# Patient Record
Sex: Male | Born: 1985 | Hispanic: No | Marital: Single | Smoking: Current some day smoker
Health system: Southern US, Community
[De-identification: ages and names within clinical notes are randomized; demographics above are authoritative.]

## PROBLEM LIST (undated history)

## (undated) DIAGNOSIS — M1990 Unspecified osteoarthritis, unspecified site: Secondary | ICD-10-CM

---

## 2008-04-01 ENCOUNTER — Emergency Department (HOSPITAL_COMMUNITY): Admission: EM | Admit: 2008-04-01 | Discharge: 2008-04-01 | Payer: Self-pay | Admitting: Emergency Medicine

## 2010-09-28 ENCOUNTER — Emergency Department (HOSPITAL_BASED_OUTPATIENT_CLINIC_OR_DEPARTMENT_OTHER)
Admission: EM | Admit: 2010-09-28 | Discharge: 2010-09-28 | Disposition: A | Discharge status: Home / Self Care | Payer: Self-pay | Attending: Emergency Medicine | Admitting: Emergency Medicine

## 2010-09-28 DIAGNOSIS — N471 Phimosis: Secondary | ICD-10-CM | POA: Insufficient documentation

## 2010-09-28 LAB — URINE MICROSCOPIC-ADD ON

## 2010-09-28 LAB — URINALYSIS, ROUTINE W REFLEX MICROSCOPIC
Glucose, UA: NEGATIVE mg/dL
Protein, ur: NEGATIVE mg/dL
Urobilinogen, UA: 1 mg/dL (ref 0.0–1.0)

## 2010-09-29 LAB — HIV ANTIBODY (ROUTINE TESTING W REFLEX): HIV: NONREACTIVE

## 2010-09-29 LAB — URINE CULTURE: Culture: NO GROWTH

## 2010-09-29 LAB — GC/CHLAMYDIA PROBE AMP, GENITAL: GC Probe Amp, Genital: NEGATIVE

## 2010-09-29 LAB — RPR: RPR Ser Ql: NONREACTIVE

## 2010-11-07 ENCOUNTER — Inpatient Hospital Stay (HOSPITAL_COMMUNITY)
Admission: AD | Admit: 2010-11-07 | Discharge: 2010-11-08 | DRG: 208 | Disposition: A | Discharge status: Home / Self Care | Payer: Self-pay | Source: Other Acute Inpatient Hospital | Attending: Urology | Admitting: Urology

## 2010-11-07 ENCOUNTER — Ambulatory Visit (HOSPITAL_BASED_OUTPATIENT_CLINIC_OR_DEPARTMENT_OTHER)
Admission: RE | Admit: 2010-11-07 | Discharge: 2010-11-07 | Payer: Self-pay | Source: Ambulatory Visit | Attending: Urology | Admitting: Urology

## 2010-11-07 ENCOUNTER — Ambulatory Visit (HOSPITAL_COMMUNITY): Payer: Self-pay

## 2010-11-07 DIAGNOSIS — J9600 Acute respiratory failure, unspecified whether with hypoxia or hypercapnia: Secondary | ICD-10-CM

## 2010-11-07 DIAGNOSIS — J45909 Unspecified asthma, uncomplicated: Secondary | ICD-10-CM | POA: Diagnosis present

## 2010-11-07 DIAGNOSIS — N478 Other disorders of prepuce: Secondary | ICD-10-CM | POA: Insufficient documentation

## 2010-11-07 DIAGNOSIS — N471 Phimosis: Secondary | ICD-10-CM | POA: Diagnosis present

## 2010-11-07 DIAGNOSIS — T17208A Unspecified foreign body in pharynx causing other injury, initial encounter: Secondary | ICD-10-CM | POA: Insufficient documentation

## 2010-11-07 DIAGNOSIS — IMO0002 Reserved for concepts with insufficient information to code with codable children: Secondary | ICD-10-CM | POA: Insufficient documentation

## 2010-11-07 DIAGNOSIS — J984 Other disorders of lung: Secondary | ICD-10-CM | POA: Diagnosis present

## 2010-11-07 DIAGNOSIS — R918 Other nonspecific abnormal finding of lung field: Secondary | ICD-10-CM | POA: Diagnosis present

## 2010-11-07 DIAGNOSIS — Z5309 Procedure and treatment not carried out because of other contraindication: Secondary | ICD-10-CM | POA: Insufficient documentation

## 2010-11-07 DIAGNOSIS — F17200 Nicotine dependence, unspecified, uncomplicated: Secondary | ICD-10-CM | POA: Diagnosis present

## 2010-11-07 DIAGNOSIS — J9819 Other pulmonary collapse: Secondary | ICD-10-CM

## 2010-11-07 DIAGNOSIS — T17308A Unspecified foreign body in larynx causing other injury, initial encounter: Secondary | ICD-10-CM | POA: Diagnosis present

## 2010-11-07 LAB — MRSA PCR SCREENING: MRSA by PCR: NEGATIVE

## 2010-11-08 ENCOUNTER — Inpatient Hospital Stay (HOSPITAL_COMMUNITY): Payer: Self-pay

## 2010-11-08 LAB — CBC
MCV: 88.1 fL (ref 78.0–100.0)
Platelets: 237 10*3/uL (ref 150–400)
RBC: 5.48 MIL/uL (ref 4.22–5.81)
RDW: 12.8 % (ref 11.5–15.5)
WBC: 9.3 10*3/uL (ref 4.0–10.5)

## 2010-12-15 ENCOUNTER — Inpatient Hospital Stay (INDEPENDENT_AMBULATORY_CARE_PROVIDER_SITE_OTHER)
Admission: RE | Admit: 2010-12-15 | Discharge: 2010-12-15 | Disposition: A | Discharge status: Home / Self Care | Payer: Self-pay | Source: Ambulatory Visit

## 2010-12-15 DIAGNOSIS — H109 Unspecified conjunctivitis: Secondary | ICD-10-CM

## 2011-01-13 NOTE — Discharge Summary (Signed)
  NAMEDAMARI, SUASTEGUI NO.:  [id number]  MEDICAL RECORD NO.:  [id number]  LOCATION:  1231                         FACILITY:  [facility]  PHYSICIAN:  Bertram Millard. Irys Nigh, M.D.DATE OF BIRTH:  1986/01/17  DATE OF ADMISSION:  11/07/2010 DATE OF DISCHARGE:  11/08/2010                              DISCHARGE SUMMARY   PRIMARY DIAGNOSIS:  Aspiration during anesthesia  OTHER DIAGNOSIS:  Phimosis.  PRINCIPAL PROCEDURE:  None.  BRIEF HISTORY:  This 25 year old male presented to my office with phimosis.  He was scheduled for anesthetic circumcision/dorsal slit on Nov 07, 2010.  The patient was brought to the operating room, and after induction of anesthesia, aspirated.  He underwent emergent intubation and the procedure was cancelled.  Because of his aspiration, the patient is admitted to the hospital postanesthesia for aspiration and pulmonary management.  HOSPITAL COURSE:  Following the patient's aspiration after induction (it was felt that he did not maintain n.p.o. status), the patient was taken from the recovery room at [facility] to the intensive care unit.  Because of his aspiration, critical care consultation was obtained.  The patient was put on nebulizers and oxygen supplementation. Amazingly, his pulmonary status improved rapidly.  Chest x-ray revealed minimal subsegmental atelectasis in the right lower lobe but no other abnormality.  As the patient had no significant pulmonary or hemodynamic consequences, he was discharged on postoperative day #1 or he was discharged on Nov 08, 2010.  He will call to follow up with me in the office and we will attempt eventual dorsal slit circumcision in the office.  He was not discharged on any specific medications.  He was in improved condition at the time of discharge.     Bertram Millard. Retta Diones, M.D.     SMD/MEDQ  D:  12/29/2010  T:  12/29/2010  Job:  [id number]  Electronically Signed by Marcine Matar  M.D. on 01/13/2011 04:50:34 PM

## 2011-01-31 NOTE — H&P (Signed)
  [name], [name] NO.:  [id number]  MEDICAL RECORD NO.:  [id number]  LOCATION:  1231                         FACILITY:  [facility]  PHYSICIAN:  Bertram Millard. Dyan Labarbera, M.D.DATE OF BIRTH:  [birth year]  DATE OF ADMISSION:  11/07/2010 DATE OF DISCHARGE:  11/08/2010                             HISTORY & PHYSICAL   PRIMARY DIAGNOSIS:  Intraoperative aspiration.  SECONDARY DIAGNOSIS:  Phimosis.  PROCEDURES:  None.  BRIEF HISTORY:  This is a 25 year old male who originally presented to my office in April of this year with significant foreskin problems.  The patient has phimosis and tearing of the foreskin with retraction with intercourse.  He desires circumcision, but would like a dorsal slit rather than a circumcision to remove his foreskin.  He presented to the operating room for anesthetic procedure to have the circumcision.  Unfortunately, after induction of anesthesia, the patient vomited. There was obvious aspiration.  He had a large volume of vomitus, and aspirate.  Prompt attention by the anesthesia staff was given to the patient.  He was intubated, his lungs were evacuated of all the vomitus, and he is admitted for observation.  His past medical history is significant only for asthma.  He has had no prior surgical procedures.  He is not on any regular medications.  There are no known drug allergies.  The patient is single.  He is an International aid/development worker at [employer].  He is a cigarette smoker.  He drinks occasionally.  REVIEW OF SYSTEMS:  Unremarkable except for dysuria and penile pain and occasional nausea.  PHYSICAL EXAMINATION:  GENERAL:  Pleasant, healthy-appearing young adult male. HEENT:  Normal. NECK:  Supple. CHEST:  Revealed some rhonchi bilaterally. HEART:  Regular rate and rhythm. ABDOMEN:  Unremarkable.  Phallus was uncircumcised.  There was phimosis noted. GENITOURINARY:  Exam was otherwise unremarkable.  Chest x-ray was pending at  the time of his admission.  IMPRESSION: 1. Aspiration while under anesthesia. 2. Phimosis.  His surgery was cancelled.  PLAN: 1. We will admit him to the step-down unit for pulmonary management. 2. We will observe him and treat his aspiration appropriately.     Bertram Millard. [name], M.D.     SMD/MEDQ  D:  01/13/2011  T:  01/13/2011  Job:  [id number]  Electronically Signed by Marcine Matar M.D. on 01/31/2011 04:53:07 PM

## 2014-01-23 ENCOUNTER — Emergency Department (HOSPITAL_BASED_OUTPATIENT_CLINIC_OR_DEPARTMENT_OTHER)
Admission: EM | Admit: 2014-01-23 | Discharge: 2014-01-23 | Disposition: A | Discharge status: Home / Self Care | Payer: Self-pay | Attending: Emergency Medicine | Admitting: Emergency Medicine

## 2014-01-23 ENCOUNTER — Emergency Department (HOSPITAL_BASED_OUTPATIENT_CLINIC_OR_DEPARTMENT_OTHER): Payer: Self-pay

## 2014-01-23 ENCOUNTER — Encounter (HOSPITAL_BASED_OUTPATIENT_CLINIC_OR_DEPARTMENT_OTHER): Payer: Self-pay | Admitting: Emergency Medicine

## 2014-01-23 DIAGNOSIS — R1032 Left lower quadrant pain: Secondary | ICD-10-CM | POA: Insufficient documentation

## 2014-01-23 DIAGNOSIS — N200 Calculus of kidney: Secondary | ICD-10-CM | POA: Insufficient documentation

## 2014-01-23 DIAGNOSIS — Z8739 Personal history of other diseases of the musculoskeletal system and connective tissue: Secondary | ICD-10-CM | POA: Insufficient documentation

## 2014-01-23 DIAGNOSIS — E669 Obesity, unspecified: Secondary | ICD-10-CM | POA: Insufficient documentation

## 2014-01-23 DIAGNOSIS — F17200 Nicotine dependence, unspecified, uncomplicated: Secondary | ICD-10-CM | POA: Insufficient documentation

## 2014-01-23 HISTORY — DX: Unspecified osteoarthritis, unspecified site: M19.90

## 2014-01-23 LAB — URINALYSIS, ROUTINE W REFLEX MICROSCOPIC
Bilirubin Urine: NEGATIVE
Glucose, UA: NEGATIVE mg/dL
KETONES UR: NEGATIVE mg/dL
Nitrite: NEGATIVE
PROTEIN: 30 mg/dL — AB
Specific Gravity, Urine: 1.019 (ref 1.005–1.030)
UROBILINOGEN UA: 0.2 mg/dL (ref 0.0–1.0)
pH: 6 (ref 5.0–8.0)

## 2014-01-23 LAB — CBC WITH DIFFERENTIAL/PLATELET
BASOS ABS: 0 10*3/uL (ref 0.0–0.1)
BASOS PCT: 0 % (ref 0–1)
EOS ABS: 0.1 10*3/uL (ref 0.0–0.7)
EOS PCT: 1 % (ref 0–5)
HEMATOCRIT: 48.2 % (ref 39.0–52.0)
Hemoglobin: 17.2 g/dL — ABNORMAL HIGH (ref 13.0–17.0)
Lymphocytes Relative: 10 % — ABNORMAL LOW (ref 12–46)
Lymphs Abs: 1.1 10*3/uL (ref 0.7–4.0)
MCH: 30.2 pg (ref 26.0–34.0)
MCHC: 35.7 g/dL (ref 30.0–36.0)
MCV: 84.7 fL (ref 78.0–100.0)
MONO ABS: 0.5 10*3/uL (ref 0.1–1.0)
Monocytes Relative: 5 % (ref 3–12)
Neutro Abs: 9 10*3/uL — ABNORMAL HIGH (ref 1.7–7.7)
Neutrophils Relative %: 85 % — ABNORMAL HIGH (ref 43–77)
Platelets: 254 10*3/uL (ref 150–400)
RBC: 5.69 MIL/uL (ref 4.22–5.81)
RDW: 13.1 % (ref 11.5–15.5)
WBC: 10.6 10*3/uL — ABNORMAL HIGH (ref 4.0–10.5)

## 2014-01-23 LAB — HEPATIC FUNCTION PANEL
ALT: 122 U/L — ABNORMAL HIGH (ref 0–53)
AST: 53 U/L — ABNORMAL HIGH (ref 0–37)
Albumin: 4.5 g/dL (ref 3.5–5.2)
Alkaline Phosphatase: 84 U/L (ref 39–117)
Total Bilirubin: 0.4 mg/dL (ref 0.3–1.2)
Total Protein: 7.9 g/dL (ref 6.0–8.3)

## 2014-01-23 LAB — URINE MICROSCOPIC-ADD ON

## 2014-01-23 LAB — BASIC METABOLIC PANEL
ANION GAP: 11 (ref 5–15)
BUN: 13 mg/dL (ref 6–23)
CALCIUM: 10 mg/dL (ref 8.4–10.5)
CO2: 27 mEq/L (ref 19–32)
CREATININE: 0.9 mg/dL (ref 0.50–1.35)
Chloride: 102 mEq/L (ref 96–112)
GFR calc non Af Amer: >90 mL/min (ref >90)
Glucose, Bld: 133 mg/dL — ABNORMAL HIGH (ref 70–99)
Potassium: 5.8 mEq/L — ABNORMAL HIGH (ref 3.7–5.3)
Sodium: 140 mEq/L (ref 137–147)

## 2014-01-23 LAB — LIPASE, BLOOD: Lipase: 14 U/L (ref 11–59)

## 2014-01-23 MED ORDER — HYDROMORPHONE HCL PF 1 MG/ML IJ SOLN
1.0000 mg | Freq: Once | INTRAMUSCULAR | Status: AC
  Administered 2014-01-23: 1 mg via INTRAVENOUS
  Filled 2014-01-23: qty 1

## 2014-01-23 MED ORDER — MORPHINE SULFATE 4 MG/ML IJ SOLN
4.0000 mg | Freq: Once | INTRAMUSCULAR | Status: AC
  Administered 2014-01-23: 4 mg via INTRAVENOUS
  Filled 2014-01-23: qty 1

## 2014-01-23 MED ORDER — ONDANSETRON HCL 4 MG/2ML IJ SOLN
4.0000 mg | Freq: Once | INTRAMUSCULAR | Status: AC
  Administered 2014-01-23: 4 mg via INTRAVENOUS
  Filled 2014-01-23: qty 2

## 2014-01-23 MED ORDER — SODIUM CHLORIDE 0.9 % IV BOLUS (SEPSIS)
1000.0000 mL | Freq: Once | INTRAVENOUS | Status: AC
  Administered 2014-01-23: 1000 mL via INTRAVENOUS

## 2014-01-23 MED ORDER — PROMETHAZINE HCL 25 MG PO TABS: 25.0000 mg | ORAL_TABLET | Freq: Four times a day (QID) | ORAL | Status: AC | PRN

## 2014-01-23 MED ORDER — OXYCODONE-ACETAMINOPHEN 5-325 MG PO TABS: ORAL_TABLET | ORAL | Status: AC

## 2014-01-23 MED ORDER — OXYCODONE-ACETAMINOPHEN 5-325 MG PO TABS
1.0000 | ORAL_TABLET | Freq: Once | ORAL | Status: AC
  Administered 2014-01-23: 1 via ORAL
  Filled 2014-01-23: qty 1

## 2014-01-23 NOTE — ED Notes (Signed)
Pt tolerating water with no n/v/d noted.

## 2014-01-23 NOTE — ED Provider Notes (Signed)
Medical screening examination/treatment/procedure(s) were performed by non-physician practitioner and as supervising physician I was immediately available for consultation/collaboration.   EKG Interpretation None        Shabrea Weldin, MD 01/23/14 2320 

## 2014-01-23 NOTE — ED Provider Notes (Signed)
CSN: [id number]     Arrival date & time 01/23/14  1338 History   First MD Initiated Contact with Patient 01/23/14 1402     Chief Complaint  Patient presents with  . Abdominal Pain     (Consider location/radiation/quality/duration/timing/severity/associated sxs/prior Treatment) HPI   [name] is a 28 y.o. male who is otherwise healthy complaining of acute onset of non radiating left lower quadrant pain onset 3 hours ago associated with 5 episodes of nonbloody, nonbilious, coffee-ground emesis. Patient denies fever, chills, diarrhea, hematuria, chest pain or shortness of breath. No pain medication prior to arrival. States pain is relieved by pressing on the affected area. No prior similar episodes, no history of kidney stones. Patient states that he had an opiate addiction problem in the past (states it was pills mostly Vicodin) and requests nonnarcotic pain medication  Past Medical History  Diagnosis Date  . Arthritis    History reviewed. No pertinent past surgical history. No family history on file. History  Substance Use Topics  . Smoking status: Current Some Day Smoker  . Smokeless tobacco: Not on file  . Alcohol Use: Yes     Comment: occ    Review of Systems 10 systems reviewed and found to be negative, except as noted in the HPI.    Allergies  Review of patient's allergies indicates no known allergies.  Home Medications   Prior to Admission medications   Medication Sig Start Date End Date Taking? Authorizing Provider  oxyCODONE-acetaminophen (PERCOCET/ROXICET) 5-325 MG per tablet 1 to 2 tabs PO q6hrs  PRN for pain 01/23/14   Joni Reining Judye Lorino, PA-C  promethazine (PHENERGAN) 25 MG tablet Take 1 tablet (25 mg total) by mouth every 6 (six) hours as needed for nausea or vomiting. 01/23/14   Joni Reining Arda Keadle, PA-C   BP 129/82  Pulse 92  Temp(Src) 98.3 F (36.8 C)  Resp 18  SpO2 100% Physical Exam  Nursing note and vitals reviewed. Constitutional: He is oriented to  person, place, and time. He appears well-developed and well-nourished. No distress.  Obese, shaking bilateral legs, appears uncomfortable  HENT:  Head: Normocephalic.  Mouth/Throat: Oropharynx is clear and moist.  Eyes: Conjunctivae and EOM are normal. Pupils are equal, round, and reactive to light.  Cardiovascular: Normal rate.   Pulmonary/Chest: Effort normal and breath sounds normal. No stridor. No respiratory distress. He has no wheezes. He has no rales. He exhibits no tenderness.  Abdominal: Soft. There is tenderness.  Mild tenderness to deep palpation of the left side at the level of the umbilicus, no guarding or rebound. No CVA tenderness bilaterally  Musculoskeletal: Normal range of motion.  Neurological: He is alert and oriented to person, place, and time.  Psychiatric: He has a normal mood and affect.    ED Course  Procedures (including critical care time) Labs Review Labs Reviewed  CBC WITH DIFFERENTIAL - Abnormal; Notable for the following:    WBC 10.6 (*)    Hemoglobin 17.2 (*)    Neutrophils Relative % 85 (*)    Neutro Abs 9.0 (*)    Lymphocytes Relative 10 (*)    All other components within normal limits  BASIC METABOLIC PANEL - Abnormal; Notable for the following:    Potassium 5.8 (*)    Glucose, Bld 133 (*)    All other components within normal limits  HEPATIC FUNCTION PANEL - Abnormal; Notable for the following:    AST 53 (*)    ALT 122 (*)    All other components within  normal limits  URINALYSIS, ROUTINE W REFLEX MICROSCOPIC - Abnormal; Notable for the following:    Color, Urine AMBER (*)    APPearance CLOUDY (*)    Hgb urine dipstick LARGE (*)    Protein, ur 30 (*)    Leukocytes, UA TRACE (*)    All other components within normal limits  LIPASE, BLOOD  URINE MICROSCOPIC-ADD ON    Imaging Review Ct Abdomen Pelvis Wo Contrast  01/23/2014   CLINICAL DATA:  Left flank pain, vomiting, nausea  EXAM: CT ABDOMEN AND PELVIS WITHOUT CONTRAST  TECHNIQUE:  Multidetector CT imaging of the abdomen and pelvis was performed following the standard protocol without IV contrast.  COMPARISON:  None.  FINDINGS: Lung bases are unremarkable. Sagittal images of the spine are unremarkable.  Unenhanced liver, pancreas, spleen and adrenals are unremarkable.  No calcified gallstones are noted within gallbladder. No aortic aneurysm. No small bowel obstruction. There is mild left hydronephrosis and proximal left hydroureter. Mild left perinephric stranding. In axial image 51 there is a calcified calculus in proximal left ureter measures 3 mm at the level of upper endplate of L4 vertebral body.  The right ureter is unremarkable.  Bilateral distal ureter is unremarkable. No calcified calculi are noted within bladder. Prostate gland and seminal vesicles are unremarkable.  There is no pericecal inflammation. Normal appendix visualized in axial image 52. The terminal ileum is unremarkable. No inguinal adenopathy. No destructive bony lesions are noted within pelvis.  No small bowel obstruction.  No ascites or free air.  No adenopathy.  IMPRESSION: 1. There is mild left hydronephrosis and proximal left hydroureter. 2. There is 3 mm calcified calculus in proximal left ureter at the level of upper endplate of L4 vertebral body. Mild left perinephric stranding. 3. Normal appendix.  No pericecal inflammation. 4. Bilateral distal ureter is unremarkable. No calcified calculi are noted within urinary bladder.   Electronically Signed   By: Natasha Mead M.D.   On: 01/23/2014 16:56     EKG Interpretation None      MDM   Final diagnoses:  Kidney stone on left side    Filed Vitals:   01/23/14 1343 01/23/14 1459 01/23/14 1653  BP: 156/112 154/91 129/82  Pulse: 72 68 92  Temp: 98.3 F (36.8 C)    Resp: 18  18  SpO2: 100% 98% 100%    Medications  morphine 4 MG/ML injection 4 mg (4 mg Intravenous Given 01/23/14 1430)  sodium chloride 0.9 % bolus 1,000 mL (0 mLs Intravenous Stopped  01/23/14 1638)  ondansetron (ZOFRAN) injection 4 mg (4 mg Intravenous Given 01/23/14 1430)  HYDROmorphone (DILAUDID) injection 1 mg (1 mg Intravenous Given 01/23/14 1504)  oxyCODONE-acetaminophen (PERCOCET/ROXICET) 5-325 MG per tablet 1 tablet (1 tablet Oral Given 01/23/14 1642)    [name] is a 28 y.o. male presenting with acute onset of severe left lower quadrant pain and several episodes of emesis. Urinalysis with gross hematuria. Likely kidney stone. No signs of infection. CT ordered to evaluate size of stone if intervention is necessary.  CT reveals 3 mm stone. Patient is tolerating by mouth. Will be DC home with Phenergan and Percocet. Return precautions discussed  Evaluation does not show pathology that would require ongoing emergent intervention or inpatient treatment. Pt is hemodynamically stable and mentating appropriately. Discussed findings and plan with patient/guardian, who agrees with care plan. All questions answered. Return precautions discussed and outpatient follow up given.   New Prescriptions   OXYCODONE-ACETAMINOPHEN (PERCOCET/ROXICET) 5-325 MG PER TABLET  1 to 2 tabs PO q6hrs  PRN for pain   PROMETHAZINE (PHENERGAN) 25 MG TABLET    Take 1 tablet (25 mg total) by mouth every 6 (six) hours as needed for nausea or vomiting.         Wynetta Emeryicole Lorance Pickeral, PA-C 01/23/14 1722

## 2014-01-23 NOTE — ED Notes (Signed)
Patient transported to CT 

## 2014-01-23 NOTE — Discharge Instructions (Signed)
Push fluids: take small frequent sips of water or Gatorade, do not drink any soda, juice or caffeinated beverages.  Take percocet for breakthrough pain, do not drink alcohol, drive, care for children or do other critical tasks while taking percocet.  Strain all urine and retain any stone for analysis at your urologist.   Return to the ED if you develop fever, have vomiting that is not controlled with the medication or if pain becomes too severe.   Emergency Department Resource Guide 1) Find a Doctor and Pay Out of Pocket Although you won't have to find out who is covered by your insurance plan, it is a good idea to ask around and get recommendations. You will then need to call the office and see if the doctor you have chosen will accept you as a new patient and what types of options they offer for patients who are self-pay. Some doctors offer discounts or will set up payment plans for their patients who do not have insurance, but you will need to ask so you aren't surprised when you get to your appointment.  2) Contact Your Local Health Department Not all health departments have doctors that can see patients for sick visits, but many do, so it is worth a call to see if yours does. If you don't know where your local health department is, you can check in your phone book. The CDC also has a tool to help you locate your state's health department, and many state websites also have listings of all of their local health departments.  3) Find a Walk-in Clinic If your illness is not likely to be very severe or complicated, you may want to try a walk in clinic. These are popping up all over the country in pharmacies, drugstores, and shopping centers. They're usually staffed by nurse practitioners or physician assistants that have been trained to treat common illnesses and complaints. They're usually fairly quick and inexpensive. However, if you have serious medical issues or chronic medical problems, these  are probably not your best option.  No Primary Care Doctor: - Call Health Connect at  [phone] - they can help you locate a primary care doctor that  accepts your insurance, provides certain services, etc. - Physician Referral Service- [phone]  Chronic Pain Problems: Organization         Address  Phone   Notes  [facility]  [phone] Patients need to be referred by their primary care doctor.   Medication Assistance: Organization         Address  Phone   Notes  [facility] [facility] [address] [place], [place] [address] [phone] --Must be a resident of [place] [facility] [place] -- Must have NO insurance coverage whatsoever (no Medicaid/ Medicare, etc.) -- The pt. MUST have a primary care doctor that directs their care regularly and follows them in the community   MedAssist  [phone]   [employer]  [phone]    Agencies that provide inexpensive medical care: Organization         Address  Phone   Notes  [facility]  [phone]   [facility]    [phone]   [facility] [address] [place], [place] [address] [phone]   [facility] [address], [place] [phone]   [facility]anned [facility]    [phone]   [facility]    [phone]   [place] [address], [place] Phone:  [phone], Fax:  [phone] Hours of Operation:  9 am - 6 pm, M-F.  Also accepts Medicaid/Medicare and self-pay.  [facility] [address] [address] [place], [address], [place] Phone: [phone], Fax: [phone]. Hours of Operation:  8:30 am - 5:30 pm, M-F.  Also accepts Medicaid and self-pay.  [facility] [place] [address] [place] Phone: [phone]   [place], [place], [place] [phone], Ext. [phone] Mondays &  Thursdays: 7-9 AM.  First 15 patients are seen on a first come, first serve basis.    [place] Providers:  Organization         Address  Phone   Notes  [facility] [address], [place] [phone] Also accepts self-pay patients.  [facility] [address], [place]  [phone]   [place], [address], [place] [phone]   [facility] [address], [place] [phone]   Lucianne Lei [address], [place]   [phone] Only accepts [place] Access [place] patients after they have their name applied to their card.   Self-Pay (no insurance) in [facility]:  Organization         Address  Phone   Notes  Sickle Cell Patients, [facility] [place] [phone]   [facility] [place] [phone]   [facility] [place]  [place], [place], [place] [phone]   [facility]/Dr. Osei-Bonsu  [address] [place] or [address], [place] [phone] Phone number for both [place] and [place] locations is the same.  [facility] [address] [place] [phone]   [facility] [address] [place] or [address] [phone]   [facility] [address] [place] [phone], phone; [phone], fax Sees patients 1st and 3rd Saturday of every month.  Must not qualify for public or private insurance (i.e. Medicaid, Medicare, [place] Health Choice, Veterans' Benefits)  Household income should be no more than 200% of the poverty level The clinic cannot treat you if you are pregnant or think you are pregnant  Sexually transmitted diseases are not treated at the  clinic.    Dental Care: Organization         Address  Phone  Notes  [facility] [place] [address] Department of [facility] [place] [phone] Accepts children up to age 51 who are enrolled in [place] or [place]; pregnant women with a Medicaid card; and children who have applied for Medicaid or [place] Health Choice, but were declined, whose parents can pay a reduced fee at time of service.  [facility] Department of [facility]  [address], [place] [phone] Accepts children up to age 79 who are enrolled in [place] or [place]; pregnant women with a Medicaid card; and children who have applied for Medicaid or [place] Health Choice, but were declined, whose parents can pay a reduced fee at time of service.  [place]  Access PROGRAM  [place] [phone] Patients are seen by appointment only. Walk-ins are not accepted. [place] will see patients 18 years of age and older. Monday - Tuesday (8am-5pm) Most Wednesdays (8:30-5pm) $30 per visit, cash only  [facility] Adult Dental Access PROGRAM  [address], [facility] [phone] Patients are seen by appointment only. Walk-ins are not accepted. [place] will see patients 66 years of age and older. One Wednesday Evening (Monthly: Volunteer Based).  $30 per visit, cash only  [place]  [phone] for adults; Children under age 79, call Graduate Pediatric Dentistry at [phone]. Children aged 46-14, please call [phone] to request a pediatric application.  Dental services are provided in all areas of dental care including fillings, crowns and bridges, complete and partial dentures, implants, gum treatment, root canals, and extractions. Preventive care is also provided. Treatment is provided to both adults and children. Patients are selected via a lottery and there is often a  waiting list.   [facility] [address] [place]  [phone] [email or web address]   [facility] [address] [place], [place] [phone], Ext. [phone] Second and Fourth Thursday of each month, opens at 6:30 AM; Clinic ends at 9 AM.  Patients are seen on a first-come first-served basis, and a limited number are seen during each clinic.   [facility]  [address] Hillard Danker [place], [place] [phone]   Eligibility Requirements You must have lived in [place], [place], or [place] counties for at least the last three months.   You cannot be eligible for state or federal sponsored [employer], including [employer], [place], or [employer].   You generally cannot be eligible for healthcare insurance through your employer.    How to apply: Eligibility screenings are held every Tuesday and Wednesday afternoon from 1:00 pm until 4:00 pm. You do not need an appointment for the interview!  [facility] [address] [place], [place]   [place]  [place] Department  [place]  [phone]    Behavioral Health Resources in the Community: Intensive Outpatient Programs Organization         Address  Phone  Notes  [place] [address], [place], [place] [phone]   [facility] [address] [place], [place]   ADS: [facility] Drug S[facility] [address], [place], [place]   [place] [address]  [place], [place] or [phone]   Substance Abuse Resources Organization         Address  Phone  Notes  Alcohol [facility] Drug Services  [phone]   [place]  [phone]   [facility] [place]   Chinita Pester  [phone]   Residential & Outpatient Substance Abuse  Program  [phone]   Psychological Services Organization         Address  Phone  Notes  [facility] [place]  [place]  [phone]   [place] [address] [place] or [phone]    Mobile Crisis Teams Organization         Address  Phone  Notes  [facility], Mobile Crisis Care Unit  [phone]   [facility]  [address] [place],  [place] [address] [place] [phone]    Self-Help/Support Groups Organization         Address  Phone             Notes  [facility] [place] - variety of support groups  [place] Call for more information  Narcotics Anonymous (NA), [facility] [address], [employer] [place]  2 meetings at this location   Special educational needs teacher         Address  Phone  Notes  [facility]AP Residential Treatment [place],    [place]  [phone]   [facility]  [address] [place] [phone], [place], [place]   [place], [place] [phone] Admissions: 8am-3pm M-F  Incentives Substance [place] [address],    [place], [place] [phone]   [facility] [address] [place], [place], [place]   [facility] [address],  [place], [place]   [facility] - Intensive Outpatient [address] Kristeen [address], [place], [place]   [facility] ([place]  [place], [place] [phone] or [phone]   [facility] (RTS) [address], [place], [place] Accepts Medicaid  [facility] [place] [address],  [place] [phone] Substance Abuse/Addiction Treatment   [facility] Organization          Address  Phone  Notes  [facility]  [phone]   Domenic Schwab, PhD [address] Arlis Porta [place], [place]   [phone] or [phone]   [place], [place] [phone]   [facility] [address], [place], [place] [phone] Insurance/Medicaid/sponsorship through [facility] and Families [address]                                    [place], [place] [phone] [place] [address] [place], [place] [phone]    Dr. Adele Schilder  [phone]   [facility] Dept. 1) [address], [place] 2) [place] 3)  [place] [address], [place] [phone]  [phone]   [place] [phone] or [phone] (After Hours)

## 2014-01-23 NOTE — ED Notes (Signed)
Left sided abdominal pain. Vomiting. No diarrhea.

## 2015-02-21 IMAGING — CT CT ABD-PELV W/O CM
2 of 4 series · 16 of 46 positions shown, 18 images · non-contrast
Comparison: None.

CLINICAL DATA: Left flank pain, vomiting, nausea

EXAM:
CT ABDOMEN AND PELVIS WITHOUT CONTRAST
TECHNIQUE: Multidetector CT imaging of the abdomen and pelvis was performed
following the standard protocol without IV contrast.

[Series 2: renal stone < 200 lbs 5.0 b31f · axial · 0.82mm/px · z∈[-553,-108]mm · 13 of 97 slices shown, 15 images]
[im 4/97  soft-tissue]
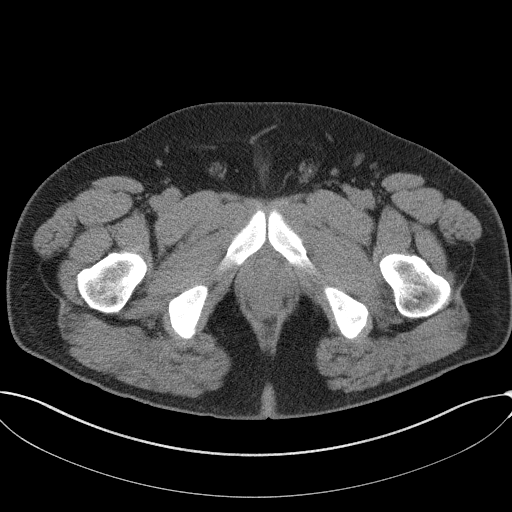
[im 4/97  bone]
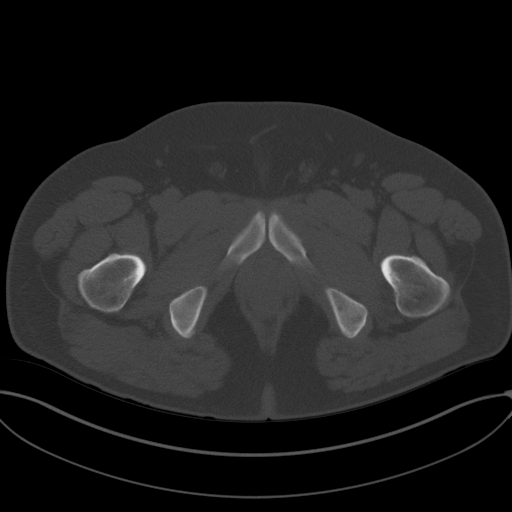
[im 12/97  soft-tissue]
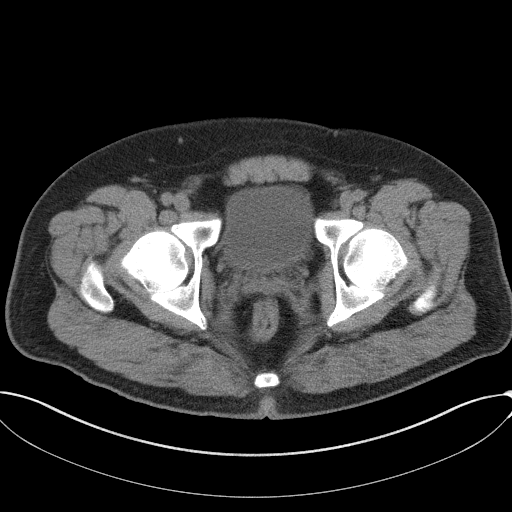
[im 20/97  soft-tissue]
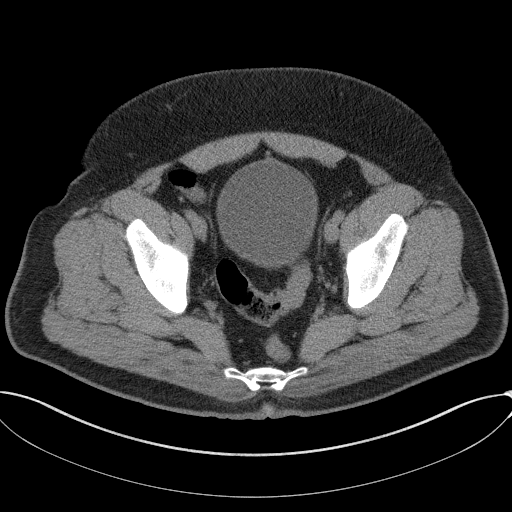
[im 27/97  soft-tissue]
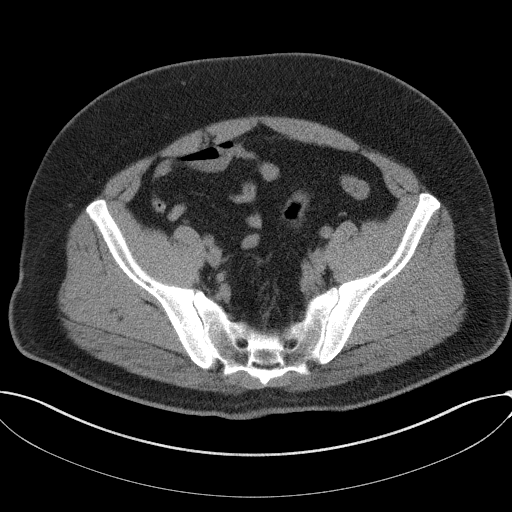
[im 35/97  soft-tissue]
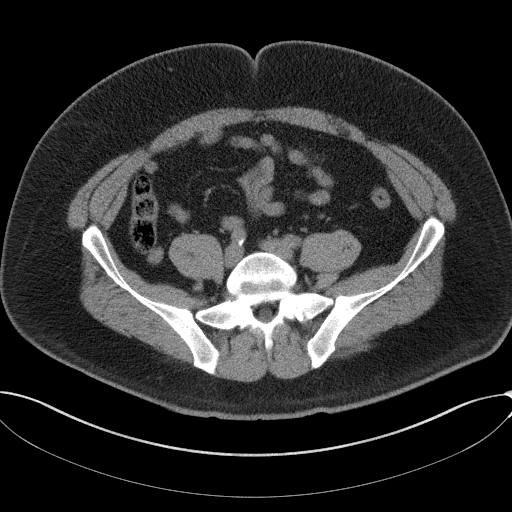
[im 43/97  soft-tissue]
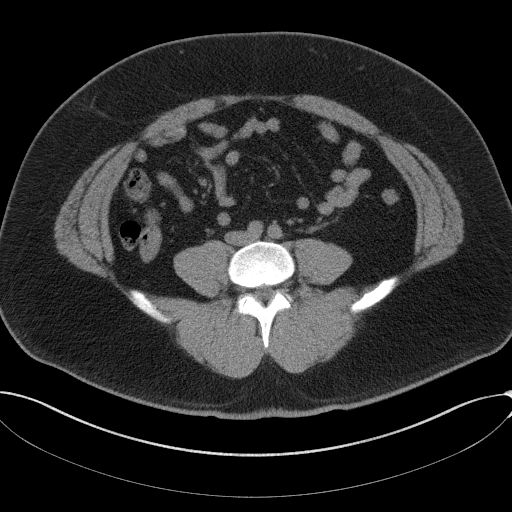
[im 50/97  soft-tissue]
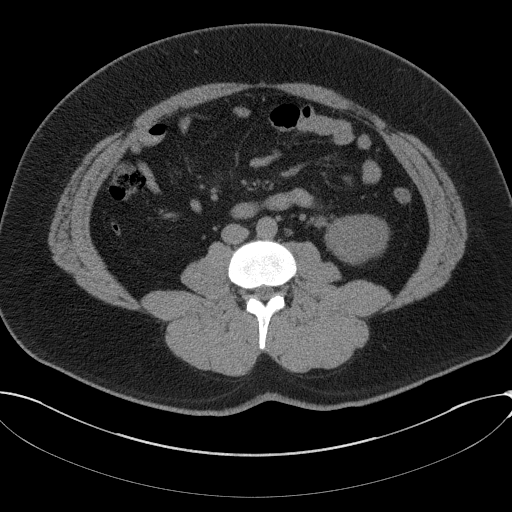
[im 54/97  soft-tissue]
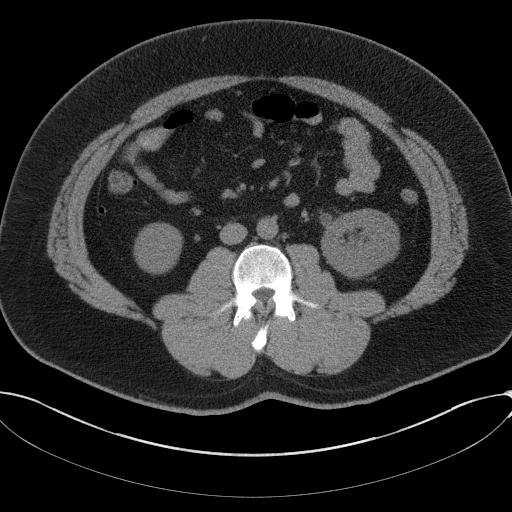
[im 62/97  soft-tissue]
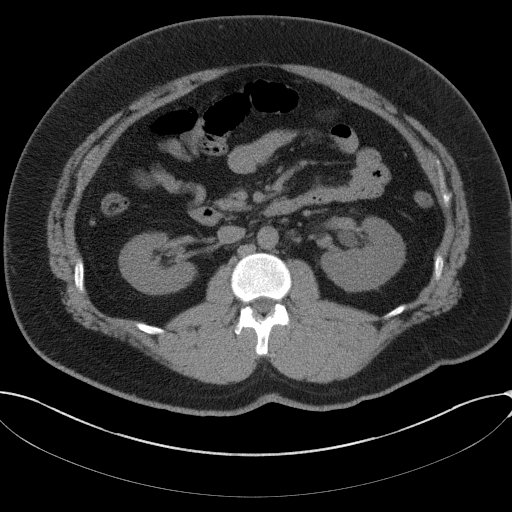
[im 62/97  bone]
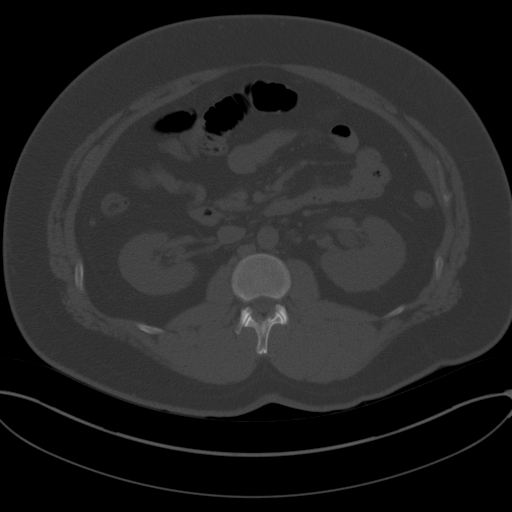
[im 70/97  soft-tissue]
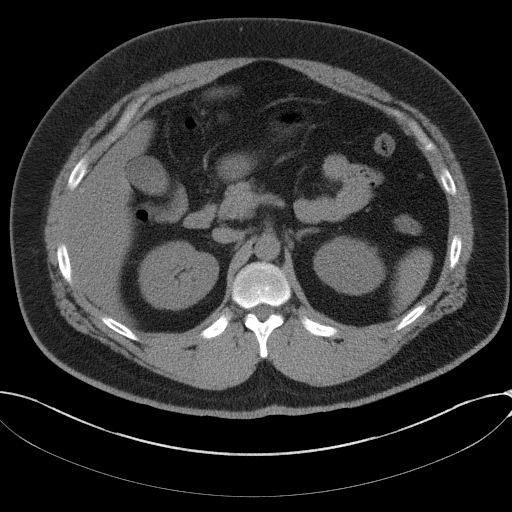
[im 77/97  soft-tissue]
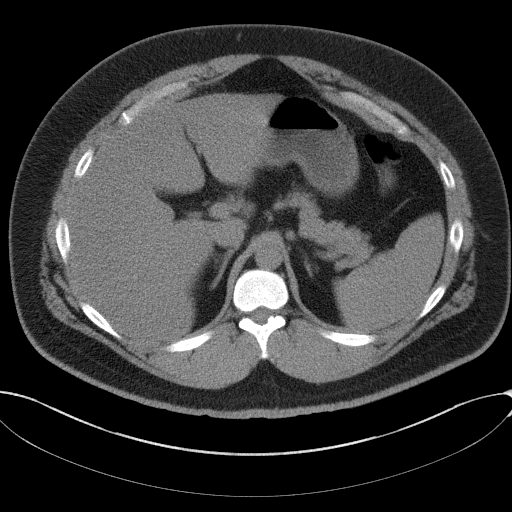
[im 85/97  soft-tissue]
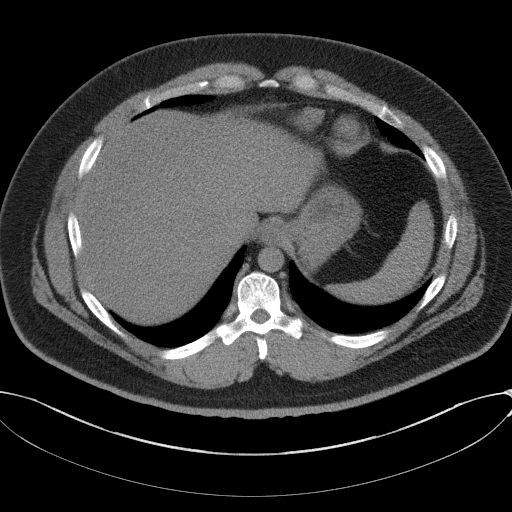
[im 93/97  soft-tissue]
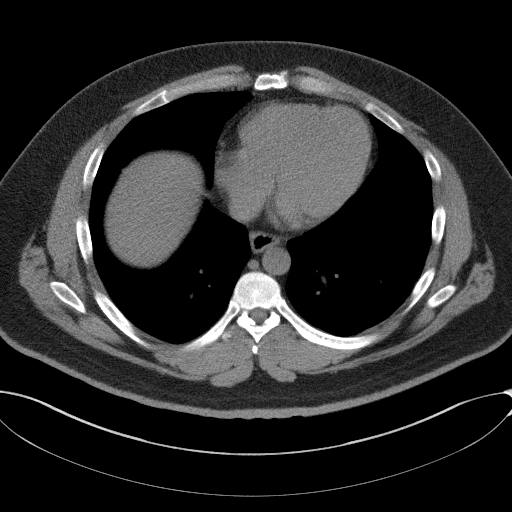

[Series 5: renal stone 3.0 coronal · coronal · 0.86mm/px · 3 of 102 slices shown]
[im 34/102  soft-tissue]
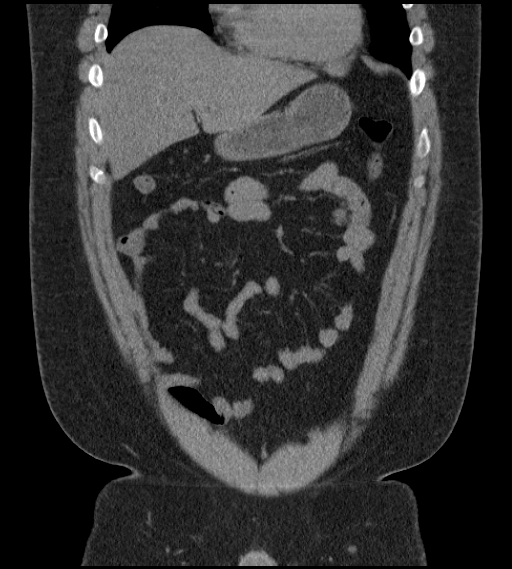
[im 45/102  soft-tissue]
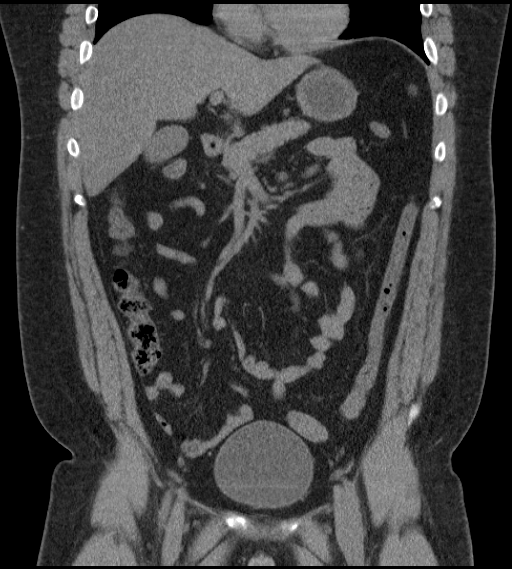
[im 57/102  soft-tissue]
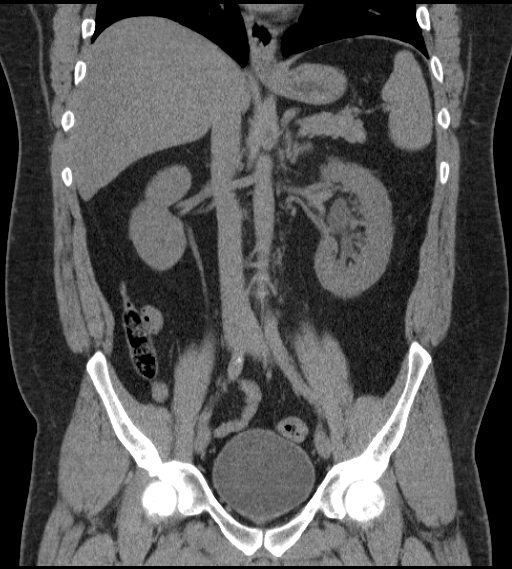

[16 of 46 positions shown; findings below may reference images not displayed]

FINDINGS: Lung bases are unremarkable. Sagittal images of the spine are
unremarkable.

Unenhanced liver, pancreas, spleen and adrenals are unremarkable.

No calcified gallstones are noted within gallbladder. No aortic
aneurysm. No small bowel obstruction. There is mild left
hydronephrosis and proximal left hydroureter. Mild left perinephric
stranding. In axial image 51 there is a calcified calculus in
proximal left ureter measures 3 mm at the level of upper endplate of
L4 vertebral body.

The right ureter is unremarkable.

Bilateral distal ureter is unremarkable. No calcified calculi are
noted within bladder. Prostate gland and seminal vesicles are
unremarkable.

There is no pericecal inflammation. Normal appendix visualized in
axial image 52. The terminal ileum is unremarkable. No inguinal
adenopathy. No destructive bony lesions are noted within pelvis.

No small bowel obstruction.  No ascites or free air.  No adenopathy.
IMPRESSION: 1. There is mild left hydronephrosis and proximal left hydroureter.
2. There is 3 mm calcified calculus in proximal left ureter at the
level of upper endplate of L4 vertebral body. Mild left perinephric
stranding.
3. Normal appendix.  No pericecal inflammation.
4. Bilateral distal ureter is unremarkable. No calcified calculi are
noted within urinary bladder.
